# Patient Record
Sex: Female | Born: 1990 | Race: White | Hispanic: Yes | Marital: Single | Smoking: Never smoker
Health system: Southern US, Community
[De-identification: ages and names within clinical notes are randomized; demographics above are authoritative.]

## PROBLEM LIST (undated history)

## (undated) ENCOUNTER — Inpatient Hospital Stay (HOSPITAL_COMMUNITY): Payer: Self-pay

---
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
## 2009-06-10 ENCOUNTER — Ambulatory Visit: Payer: Self-pay | Admitting: Family

## 2009-06-10 ENCOUNTER — Inpatient Hospital Stay (HOSPITAL_COMMUNITY): Admission: AD | Admit: 2009-06-10 | Discharge: 2009-06-10 | Payer: Self-pay | Admitting: Obstetrics & Gynecology

## 2009-09-19 ENCOUNTER — Ambulatory Visit (HOSPITAL_COMMUNITY): Admission: RE | Admit: 2009-09-19 | Discharge: 2009-09-19 | Payer: Self-pay | Admitting: Obstetrics

## 2009-10-02 ENCOUNTER — Inpatient Hospital Stay (HOSPITAL_COMMUNITY): Admission: AD | Admit: 2009-10-02 | Discharge: 2009-10-02 | Payer: Self-pay | Admitting: Obstetrics

## 2009-11-07 ENCOUNTER — Ambulatory Visit (HOSPITAL_COMMUNITY): Admission: RE | Admit: 2009-11-07 | Discharge: 2009-11-07 | Payer: Self-pay | Admitting: Obstetrics

## 2010-01-02 ENCOUNTER — Ambulatory Visit (HOSPITAL_COMMUNITY): Admission: RE | Admit: 2010-01-02 | Discharge: 2010-01-02 | Payer: Self-pay | Admitting: Obstetrics

## 2010-01-02 IMAGING — US US OB FOLLOW-UP
1 series · 14 of 28 positions shown · non-contrast
Comparison: none

OBSTETRICAL ULTRASOUND:
 This ultrasound exam was performed in the [HOSPITAL] Ultrasound Department.  The OB US report was generated in the AS system, and faxed to the ordering physician.  This report is also available in [HOSPITAL]?s AccessANYware and in [REDACTED] PACS.

[Series 1: us ob follow up · 0.27mm/px · 14 of 33 slices shown]
[im 2/33]
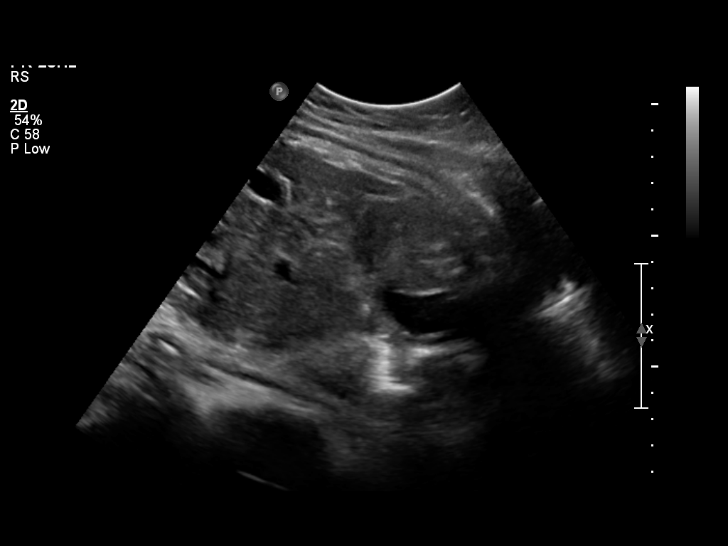
[im 4/33]
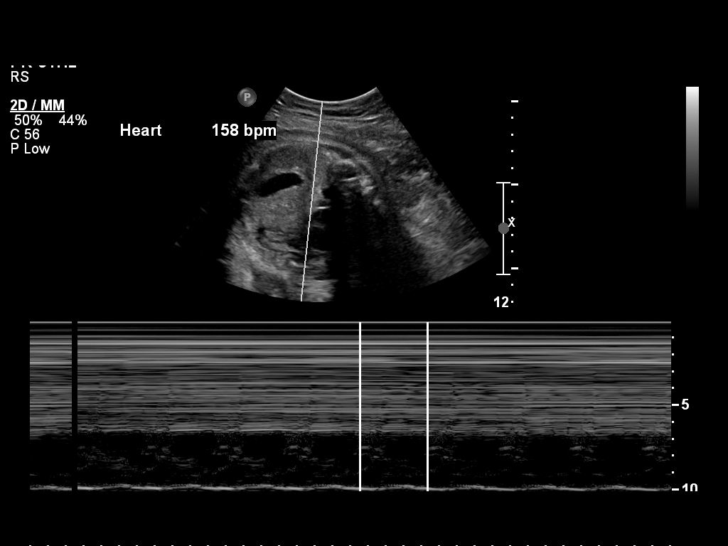
[im 6/33]
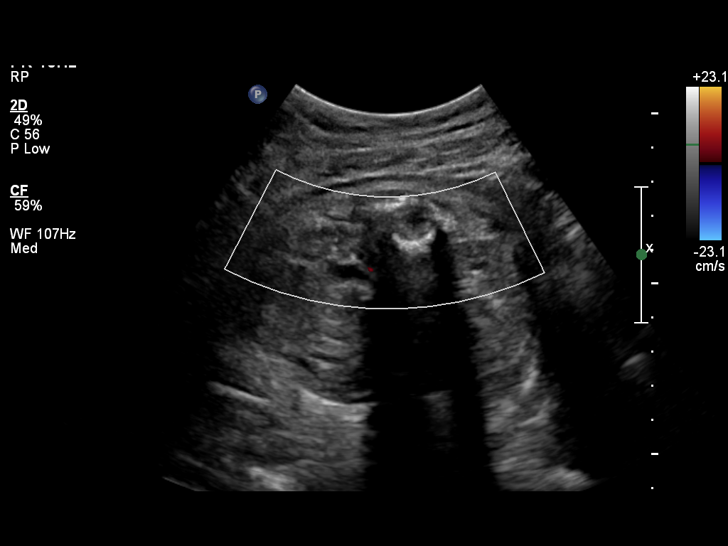
[im 9/33]
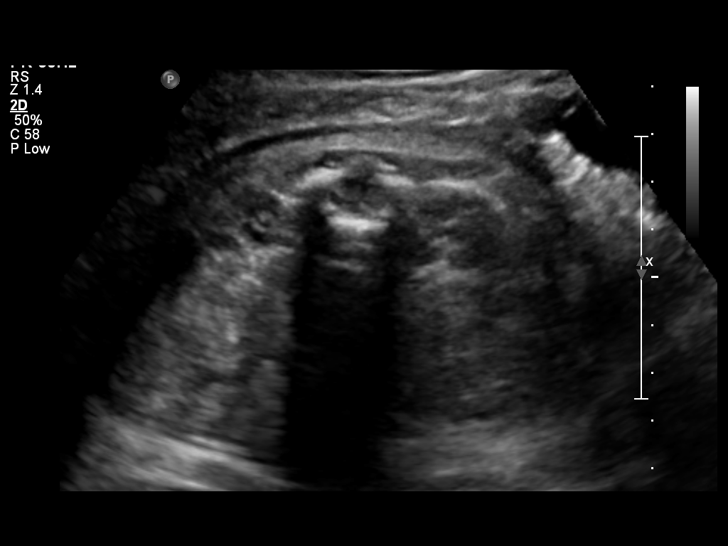
[im 11/33]
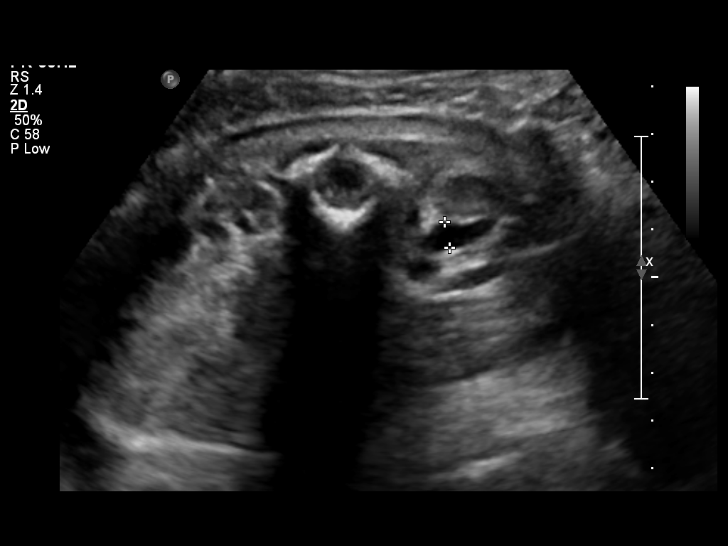
[im 14/33]
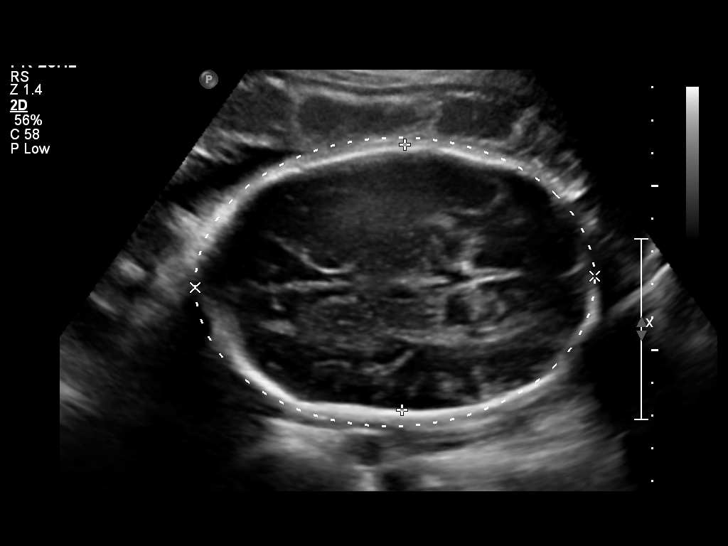
[im 16/33]
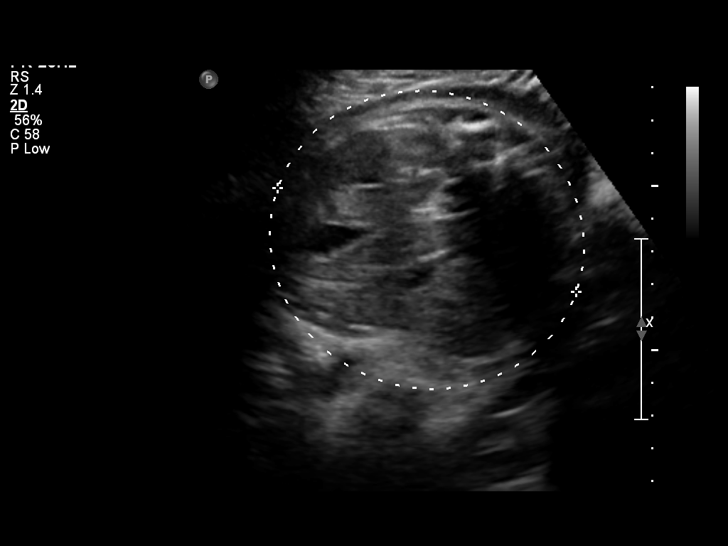
[im 18/33]
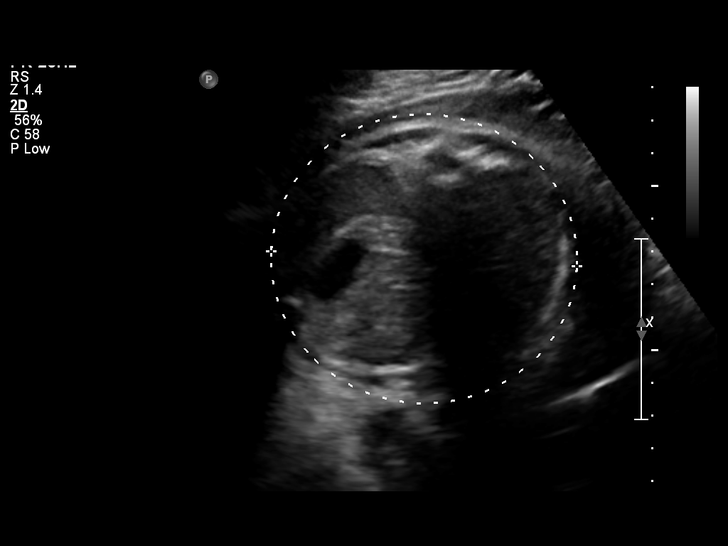
[im 21/33]
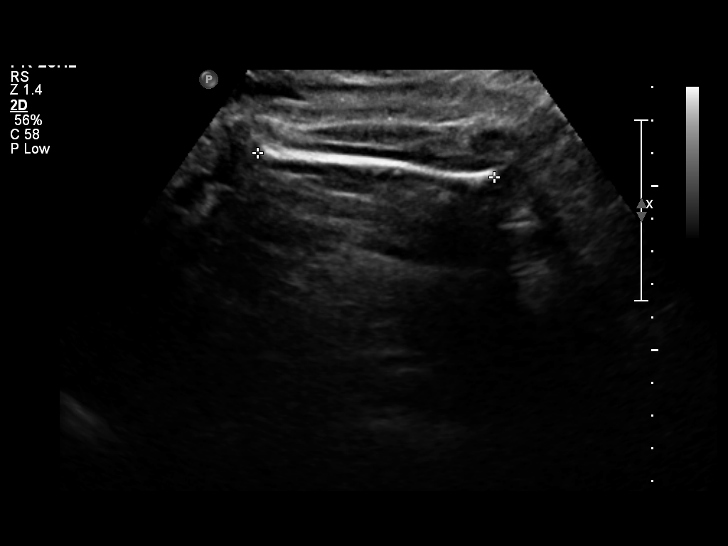
[im 23/33]
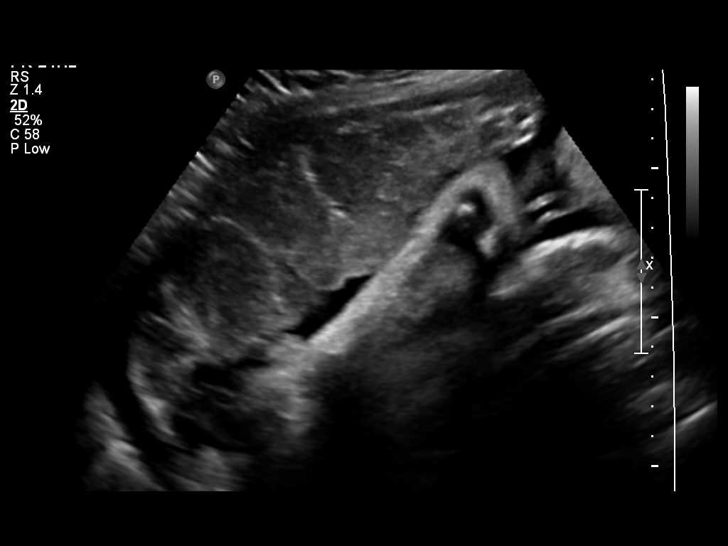
[im 25/33]
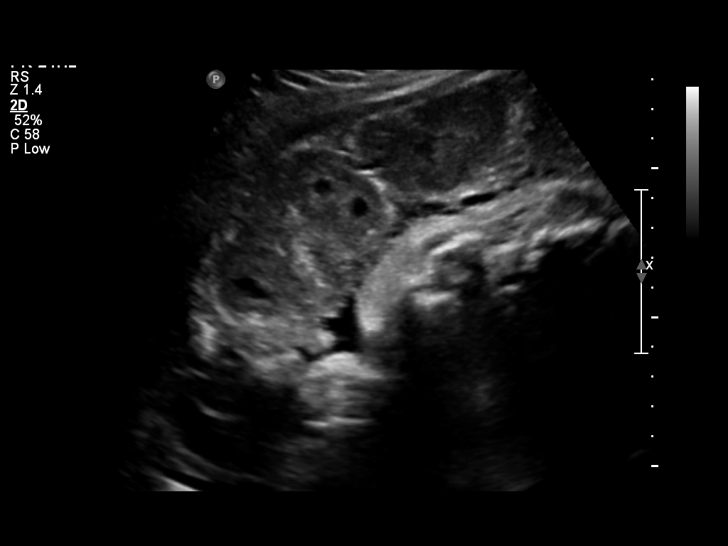
[im 28/33]
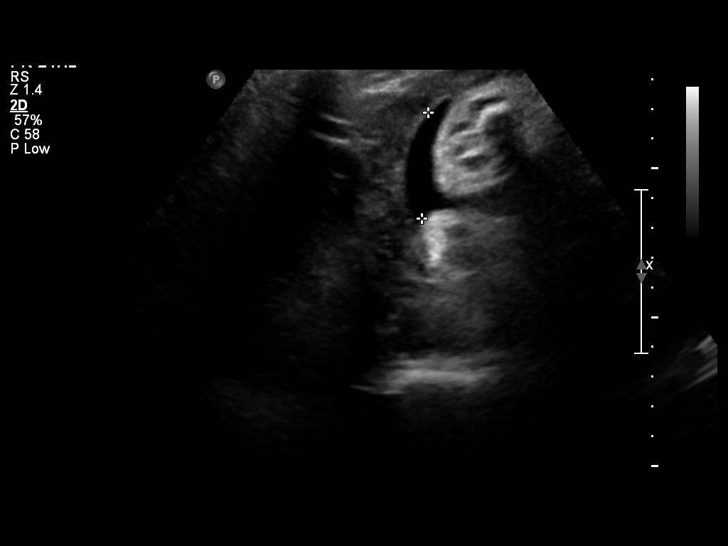
[im 30/33]
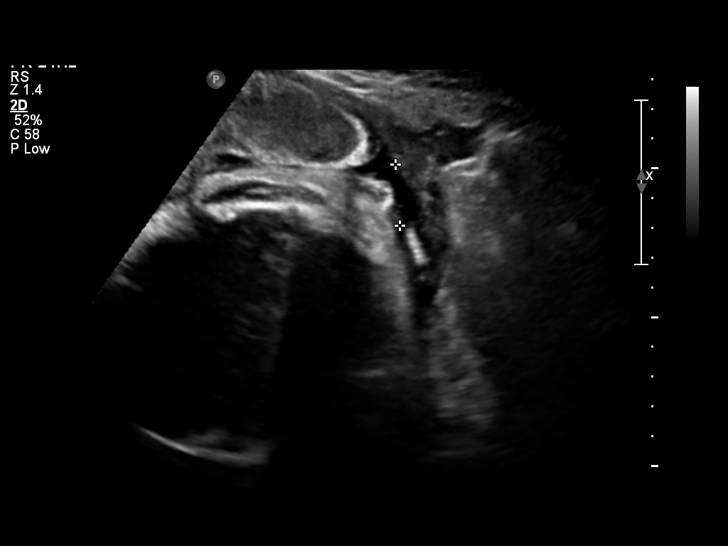
[im 33/33]
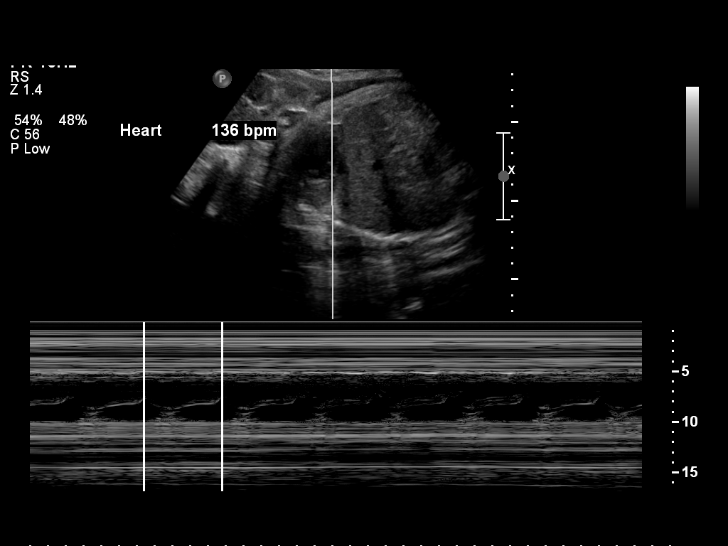

[14 of 28 positions shown; findings below may reference images not displayed]

IMPRESSION: See AS Obstetric US report.

## 2010-01-05 ENCOUNTER — Inpatient Hospital Stay (HOSPITAL_COMMUNITY): Admission: AD | Admit: 2010-01-05 | Discharge: 2010-01-05 | Payer: Self-pay | Admitting: Obstetrics

## 2010-01-10 ENCOUNTER — Ambulatory Visit (HOSPITAL_COMMUNITY): Admission: RE | Admit: 2010-01-10 | Discharge: 2010-01-10 | Payer: Self-pay | Admitting: Obstetrics

## 2010-11-17 ENCOUNTER — Encounter: Payer: Self-pay | Admitting: Obstetrics

## 2011-01-28 LAB — URINE CULTURE

## 2011-01-28 LAB — URINE MICROSCOPIC-ADD ON

## 2011-01-28 LAB — URINALYSIS, ROUTINE W REFLEX MICROSCOPIC
Nitrite: NEGATIVE
Protein, ur: NEGATIVE mg/dL

## 2011-02-01 LAB — URINALYSIS, ROUTINE W REFLEX MICROSCOPIC
Ketones, ur: NEGATIVE mg/dL
Urobilinogen, UA: 0.2 mg/dL (ref 0.0–1.0)

## 2011-02-01 LAB — WET PREP, GENITAL
Trich, Wet Prep: NONE SEEN
Yeast Wet Prep HPF POC: NONE SEEN

## 2011-02-01 LAB — URINE MICROSCOPIC-ADD ON

## 2011-02-01 LAB — HCG, QUANTITATIVE, PREGNANCY: hCG, Beta Chain, Quant, S: 24475 m[IU]/mL — ABNORMAL HIGH (ref <5)

## 2011-02-01 LAB — CBC
HCT: 34 % — ABNORMAL LOW (ref 36.0–46.0)
Hemoglobin: 11.8 g/dL — ABNORMAL LOW (ref 12.0–15.0)
MCHC: 34.7 g/dL (ref 30.0–36.0)
MCV: 91.7 fL (ref 78.0–100.0)
RDW: 13.7 % (ref 11.5–15.5)
WBC: 9.6 10*3/uL (ref 4.0–10.5)

## 2012-04-13 ENCOUNTER — Emergency Department (HOSPITAL_COMMUNITY)
Admission: EM | Admit: 2012-04-13 | Discharge: 2012-04-13 | Disposition: A | Discharge status: Other Acute Inpatient Hospital | Payer: 59 | Attending: Emergency Medicine | Admitting: Emergency Medicine

## 2012-04-13 ENCOUNTER — Encounter (HOSPITAL_COMMUNITY): Payer: Self-pay | Admitting: *Deleted

## 2012-04-13 DIAGNOSIS — R440 Auditory hallucinations: Secondary | ICD-10-CM

## 2012-04-13 DIAGNOSIS — Z0289 Encounter for other administrative examinations: Secondary | ICD-10-CM | POA: Insufficient documentation

## 2012-04-13 DIAGNOSIS — R443 Hallucinations, unspecified: Secondary | ICD-10-CM | POA: Insufficient documentation

## 2012-04-13 LAB — COMPREHENSIVE METABOLIC PANEL
AST: 16 U/L (ref 0–37)
Albumin: 4 g/dL (ref 3.5–5.2)
Calcium: 9.6 mg/dL (ref 8.4–10.5)
Chloride: 104 mEq/L (ref 96–112)
Creatinine, Ser: 0.64 mg/dL (ref 0.50–1.10)
Sodium: 137 mEq/L (ref 135–145)
Total Bilirubin: 0.4 mg/dL (ref 0.3–1.2)

## 2012-04-13 LAB — RAPID URINE DRUG SCREEN, HOSP PERFORMED
Barbiturates: NOT DETECTED
Benzodiazepines: NOT DETECTED
Cocaine: NOT DETECTED
Tetrahydrocannabinol: NOT DETECTED

## 2012-04-13 LAB — CBC
MCV: 87 fL (ref 78.0–100.0)
Platelets: 340 10*3/uL (ref 150–400)
RDW: 13.3 % (ref 11.5–15.5)
WBC: 9.1 10*3/uL (ref 4.0–10.5)

## 2012-04-13 MED ORDER — IBUPROFEN 600 MG PO TABS: 600.0000 mg | ORAL_TABLET | Freq: Three times a day (TID) | ORAL | Status: DC | PRN

## 2012-04-13 MED ORDER — NICOTINE 21 MG/24HR TD PT24: 21.0000 mg | MEDICATED_PATCH | Freq: Every day | TRANSDERMAL | Status: DC

## 2012-04-13 MED ORDER — ONDANSETRON HCL 4 MG PO TABS: 4.0000 mg | ORAL_TABLET | Freq: Three times a day (TID) | ORAL | Status: DC | PRN

## 2012-04-13 MED ORDER — ZOLPIDEM TARTRATE 5 MG PO TABS: 5.0000 mg | ORAL_TABLET | Freq: Every evening | ORAL | Status: DC | PRN

## 2012-04-13 MED ORDER — ACETAMINOPHEN 325 MG PO TABS: 650.0000 mg | ORAL_TABLET | ORAL | Status: DC | PRN

## 2012-04-13 MED ORDER — ALUM & MAG HYDROXIDE-SIMETH 200-200-20 MG/5ML PO SUSP: 30.0000 mL | ORAL | Status: DC | PRN

## 2012-04-13 NOTE — ED Notes (Signed)
Up to the bathroom 

## 2012-04-13 NOTE — ED Notes (Addendum)
Pt's mother in w/ pt.  Both are aware that the pt has been accepted at [facility] and will transfer today. Purpose for admission disussed  W/ .  Mom requesting to talk w/ EDP about the necessity of the admission.  Dr Patria Mane aware and will be in to talk w/ them. Address and name of he hospital and phone number given to mother.

## 2012-04-13 NOTE — ED Notes (Signed)
Significant other into see 

## 2012-04-13 NOTE — ED Notes (Signed)
Dr Patria Mane in to talk w/ pt's mother

## 2012-04-13 NOTE — ED Notes (Signed)
Pt brought here by [facility] Dept Deputy w/ IVC paperwork. Pt states since she was 21y/o she has been hearing a voice telling her to kill her father and occasionally to kill herself - pt states she has never acted on these voices and today while at school was telling the school counselor about this. Pt denies any current SI. Pt very calm and cooperative at present.

## 2012-04-13 NOTE — ED Notes (Signed)
Up to the desk on the phone 

## 2012-04-13 NOTE — ED Notes (Signed)
Pt in group 

## 2012-04-13 NOTE — ED Notes (Signed)
Dr Patria Mane in w/ pt

## 2012-04-13 NOTE — ED Notes (Signed)
Copy of chart,and  [facility] sent w/ pt

## 2012-04-13 NOTE — ED Notes (Signed)
Pt's mother in w/ pt and is upset that the pt is here. (Verbal permission from pt to talk w/mother).  Pt and mom are aware of the IVC procedures explained  Mom and pt verbalized  Understanding.  After mom left pt again denied si/hi, but reported that the voices have started coming back.  That they occur off/on and tell her to hurt her kids.  Pt reports that the voice is her voice and that she is usually alone when they occur, but some times when the kids are asleep.

## 2012-04-13 NOTE — ED Provider Notes (Signed)
Medical screening examination/treatment/procedure(s) were performed by non-physician practitioner and as supervising physician I was immediately available for consultation/collaboration.   Juquan Reznick L Adelynne Joerger, MD 04/13/12 0826 

## 2012-04-13 NOTE — ED Notes (Signed)
Dr campos in w/ pt 

## 2012-04-13 NOTE — ED Notes (Signed)
Pt ambulatory w/ sheriff's dept to [facility],  Pt pocketbook given to mother in patients presence.

## 2012-04-13 NOTE — ED Provider Notes (Signed)
History     CSN: [id number]  Arrival date & time 04/13/12  0128   First MD Initiated Contact with Patient 04/13/12 0240      Chief Complaint  Patient presents with  . Medical Clearance    (Consider location/radiation/quality/duration/timing/severity/associated sxs/prior treatment) HPI  She presents to the emergency department brought in by [facility] with IVC paperwork. Patient told her counselor today that since she was 47 she's been hearing voices telling her to kill her father and that she had children he voices are not telling her to kill her kids. She states that she never actually voices and has never had a plan to act on these voices but was concerned about them and decided to discuss it with her counselors. The counselor then made a phone call and got the IVC paperwork started in the patient being brought to the artery evaluation.. Denies being suicidal or having homicidal ideation. The patient is very calm and cooperative. The patient denies having any medical concerns she is in no acute distress her vital signs are stable.  History reviewed. No pertinent past medical history.  History reviewed. No pertinent past surgical history.  History reviewed. No pertinent family history.  History  Substance Use Topics  . Smoking status: Not on file  . Smokeless tobacco: Not on file  . Alcohol Use: Not on file    OB History    Grav Para Term Preterm Abortions TAB SAB Ect Mult Living                  Review of Systems  HEENT: denies blurry vision or change in hearing PULMONARY: Denies difficulty breathing and SOB CARDIAC: denies chest pain or heart palpitations MUSCULOSKELETAL:  denies being unable to ambulate ABDOMEN AL: denies abdominal pain GU: denies loss of bowel or urinary control NEURO: denies numbness and tingling in extremities      Allergies  Review of patient's allergies indicates no known allergies.  Home Medications  No current outpatient prescriptions on  file.  BP 108/74  Pulse 84  Temp 97.8 F (36.6 C) (Oral)  Resp 16  SpO2 99%  LMP 03/15/2012  Physical Exam  Nursing note and vitals reviewed. Constitutional: She appears well-developed and well-nourished. No distress.  HENT:  Head: Normocephalic and atraumatic.  Eyes: Pupils are equal, round, and reactive to light.  Neck: Normal range of motion. Neck supple.  Cardiovascular: Normal rate and regular rhythm.   Pulmonary/Chest: Effort normal.  Abdominal: Soft.  Neurological: She is alert.  Skin: Skin is warm and dry.    ED Course  Procedures (including critical care time)  Labs Reviewed  COMPREHENSIVE METABOLIC PANEL - Abnormal; Notable for the following:    Glucose, Bld 105 (*)     All other components within normal limits  CBC  URINE RAPID DRUG SCREEN (HOSP PERFORMED)  ETHANOL  POCT PREGNANCY, URINE   No results found.   1. Auditory hallucination       MDM  ACT to evaluate       Dorthula Matas, PA 04/13/12 [phone]

## 2012-04-13 NOTE — ED Notes (Signed)
Dr campos into see 

## 2012-04-13 NOTE — ED Notes (Signed)
1 bag of belonging sent w/ pt

## 2012-04-13 NOTE — ED Notes (Signed)
Talking w/ mother

## 2012-04-13 NOTE — ED Notes (Signed)
theraputic alternatives here to see

## 2012-04-13 NOTE — Progress Notes (Signed)
Behavioral Health Group  Co-facilitated behavioral health group w/ Corliss Marcus, MA, MEd, LPCA, NCC, for pt's in [facility]. Group was open and engaged w/ mutual sharing and support. Group focused on grief and loss, specifically losses related to mental health and substance use and how to work through grief processes associated with these losses.  Pt was progressively engaged in the group, was initially quiet but became tearful when another member became tearful. Pt shared that she was not happy with how she arrived in [facility], states that she tried to reach out to a counselor for help then the next thing she knew [employer] was at her house, handcuffed her, and brought her to [facility] ED. Pt stated "they came in my house, put me in handcuffs, the put me in a police car in front of my family and neighbors - thank God my kids were asleep. I was humiliated." Pt stated that she has been hearing voices on and off since 21y/o, stated that 1st voice told her to kill father then it stopped and current voice occ states to kill children. Pt stated that she has never felt compelled to obey voices and was seeking school counselor's advice on how to make voices stop. Pt stated she never thought she would be placed in hospital. Pt again became tearful when other group members provided support. Pt was polite and engaged, well grounded, and connected w/ other's stories of grief/loss, stated she was scared, tired, and wanted to go home.  Consuela Widener B MS, LPCA, NCC

## 2013-10-27 NOTE — L&D Delivery Note (Signed)
Delivery Note  [name] Apgars 8,9 over intact perineum.  Placenta delivered spontaneously intact with 3VC. good support and hemostasis noted and R/V exam confirms.  PH art was sent.  [place] cord blood was not done.  Mother and baby were doing well.  EBL 150cc  Tiffany Campavid Ahmari Garton, MD

## 2014-02-10 LAB — OB RESULTS CONSOLE GC/CHLAMYDIA
CHLAMYDIA, DNA PROBE: NEGATIVE
Gonorrhea: NEGATIVE

## 2014-02-10 LAB — OB RESULTS CONSOLE RUBELLA ANTIBODY, IGM: RUBELLA: IMMUNE

## 2014-02-10 LAB — OB RESULTS CONSOLE HIV ANTIBODY (ROUTINE TESTING): HIV: NONREACTIVE

## 2014-02-10 LAB — OB RESULTS CONSOLE HEPATITIS B SURFACE ANTIGEN: HEP B S AG: NEGATIVE

## 2014-02-24 LAB — OB RESULTS CONSOLE GC/CHLAMYDIA
Chlamydia: NEGATIVE
GC PROBE AMP, GENITAL: NEGATIVE

## 2014-03-22 ENCOUNTER — Other Ambulatory Visit: Payer: Self-pay

## 2014-04-27 ENCOUNTER — Other Ambulatory Visit (HOSPITAL_COMMUNITY): Payer: Self-pay | Admitting: Obstetrics and Gynecology

## 2014-04-27 DIAGNOSIS — O09899 Supervision of other high risk pregnancies, unspecified trimester: Secondary | ICD-10-CM

## 2014-04-27 DIAGNOSIS — O09219 Supervision of pregnancy with history of pre-term labor, unspecified trimester: Secondary | ICD-10-CM

## 2014-04-27 DIAGNOSIS — IMO0002 Reserved for concepts with insufficient information to code with codable children: Secondary | ICD-10-CM

## 2014-04-27 DIAGNOSIS — Z3689 Encounter for other specified antenatal screening: Secondary | ICD-10-CM

## 2014-05-03 ENCOUNTER — Encounter (HOSPITAL_COMMUNITY): Payer: Self-pay

## 2014-05-03 ENCOUNTER — Ambulatory Visit (HOSPITAL_COMMUNITY)
Admission: RE | Admit: 2014-05-03 | Discharge: 2014-05-03 | Disposition: A | Discharge status: Home / Self Care | Payer: 59 | Source: Ambulatory Visit | Attending: Obstetrics and Gynecology | Admitting: Obstetrics and Gynecology

## 2014-05-03 VITALS — BP 124/67 | HR 97 | Wt 167.8 lb

## 2014-05-03 DIAGNOSIS — IMO0002 Reserved for concepts with insufficient information to code with codable children: Secondary | ICD-10-CM

## 2014-05-03 DIAGNOSIS — O09219 Supervision of pregnancy with history of pre-term labor, unspecified trimester: Secondary | ICD-10-CM | POA: Insufficient documentation

## 2014-05-03 DIAGNOSIS — Z3689 Encounter for other specified antenatal screening: Secondary | ICD-10-CM | POA: Insufficient documentation

## 2014-05-03 DIAGNOSIS — O09899 Supervision of other high risk pregnancies, unspecified trimester: Secondary | ICD-10-CM

## 2014-05-03 DIAGNOSIS — O365990 Maternal care for other known or suspected poor fetal growth, unspecified trimester, not applicable or unspecified: Secondary | ICD-10-CM | POA: Insufficient documentation

## 2014-05-31 ENCOUNTER — Ambulatory Visit (HOSPITAL_COMMUNITY): Payer: 59

## 2014-06-06 ENCOUNTER — Ambulatory Visit (HOSPITAL_COMMUNITY)
Admission: RE | Admit: 2014-06-06 | Discharge: 2014-06-06 | Disposition: A | Discharge status: Home / Self Care | Payer: 59 | Source: Ambulatory Visit | Attending: Obstetrics and Gynecology | Admitting: Obstetrics and Gynecology

## 2014-06-06 ENCOUNTER — Encounter (HOSPITAL_COMMUNITY): Payer: Self-pay

## 2014-06-06 VITALS — BP 120/74 | HR 104 | Wt 175.0 lb

## 2014-06-06 DIAGNOSIS — Z3689 Encounter for other specified antenatal screening: Secondary | ICD-10-CM | POA: Insufficient documentation

## 2014-06-06 DIAGNOSIS — O358XX0 Maternal care for other (suspected) fetal abnormality and damage, not applicable or unspecified: Secondary | ICD-10-CM

## 2014-07-11 ENCOUNTER — Ambulatory Visit (HOSPITAL_COMMUNITY): Payer: 59

## 2014-07-25 ENCOUNTER — Encounter (HOSPITAL_COMMUNITY): Payer: Self-pay | Admitting: *Deleted

## 2014-07-25 ENCOUNTER — Observation Stay (HOSPITAL_COMMUNITY)
Admission: AD | Admit: 2014-07-25 | Discharge: 2014-07-26 | Disposition: A | Discharge status: Home / Self Care | Payer: 59 | Source: Ambulatory Visit | Attending: Obstetrics and Gynecology | Admitting: Obstetrics and Gynecology

## 2014-07-25 DIAGNOSIS — O4100X0 Oligohydramnios, unspecified trimester, not applicable or unspecified: Principal | ICD-10-CM | POA: Diagnosis present

## 2014-07-25 DIAGNOSIS — Z0371 Encounter for suspected problem with amniotic cavity and membrane ruled out: Secondary | ICD-10-CM

## 2014-07-25 DIAGNOSIS — O4103X1 Oligohydramnios, third trimester, fetus 1: Secondary | ICD-10-CM

## 2014-07-25 DIAGNOSIS — O358XX0 Maternal care for other (suspected) fetal abnormality and damage, not applicable or unspecified: Secondary | ICD-10-CM

## 2014-07-25 LAB — CBC
HEMATOCRIT: 31.4 % — AB (ref 36.0–46.0)
Hemoglobin: 10.9 g/dL — ABNORMAL LOW (ref 12.0–15.0)
MCH: 31 pg (ref 26.0–34.0)
MCHC: 34.7 g/dL (ref 30.0–36.0)
MCV: 89.2 fL (ref 78.0–100.0)
PLATELETS: 322 10*3/uL (ref 150–400)
RBC: 3.52 MIL/uL — ABNORMAL LOW (ref 3.87–5.11)
RDW: 12.9 % (ref 11.5–15.5)
WBC: 9.8 10*3/uL (ref 4.0–10.5)

## 2014-07-25 LAB — TYPE AND SCREEN
ABO/RH(D): O POS
Antibody Screen: NEGATIVE

## 2014-07-25 MED ORDER — ACETAMINOPHEN 325 MG PO TABS: 650.0000 mg | ORAL_TABLET | ORAL | Status: DC | PRN

## 2014-07-25 MED ORDER — LACTATED RINGERS IV SOLN
INTRAVENOUS | Status: DC
  Administered 2014-07-25 – 2014-07-26 (×4): via INTRAVENOUS

## 2014-07-25 MED ORDER — BETAMETHASONE SOD PHOS & ACET 6 (3-3) MG/ML IJ SUSP
12.0000 mg | Freq: Once | INTRAMUSCULAR | Status: DC
  Filled 2014-07-25: qty 2

## 2014-07-25 MED ORDER — DOCUSATE SODIUM 100 MG PO CAPS: 100.0000 mg | ORAL_CAPSULE | Freq: Every day | ORAL | Status: DC

## 2014-07-25 MED ORDER — PRENATAL MULTIVITAMIN CH
1.0000 | ORAL_TABLET | Freq: Every day | ORAL | Status: DC
  Filled 2014-07-25: qty 1

## 2014-07-25 MED ORDER — CALCIUM CARBONATE ANTACID 500 MG PO CHEW: 2.0000 | CHEWABLE_TABLET | ORAL | Status: DC | PRN

## 2014-07-25 MED ORDER — ZOLPIDEM TARTRATE 5 MG PO TABS: 5.0000 mg | ORAL_TABLET | Freq: Every evening | ORAL | Status: DC | PRN

## 2014-07-25 NOTE — H&P (Signed)
Tiffany FragminJanet Morrow is a 23 y.o. female presenting for observation due to US today showing low AFI of 8.7cm (5%) but EFW 2043gm (83%).  Pt has a history of 2 PTD with a question of IUGR in last pregnancy.  She has been on Progesterone IM since 16 weeks and has not had PTL sxs this pregnancy.  Previous US  At 29 weeks showed EFW 79% with AFI 12.7 (35%). History OB History   Grav Para Term Preterm Abortions TAB SAB Ect Mult Living   3 2 0 2 0 0 0 0 0 2      No past medical history on file. No past surgical history on file. Family History: family history is not on file. Social History:  has no tobacco, alcohol, and drug history on file.   Prenatal Transfer Tool  Maternal Diabetes: No Genetic Screening: Normal Maternal Ultrasounds/Referrals: Normal Fetal Ultrasounds or other Referrals:  None Maternal Substance Abuse:  No Significant Maternal Medications:  None Significant Maternal Lab Results:  None Other Comments:  None  ROS    Blood pressure 137/75, pulse 107, temperature 98 F (36.7 C), temperature source Oral, resp. rate 18, height 5\' 4"  (1.626 m), weight 82.555 kg (182 lb), last menstrual period 12/13/2013. Exam Physical Exam  Prenatal labs: ABO, Rh: --/--/O POS (09/29 1405) Antibody: NEG (09/29 1405) Rubella:   RPR:    HBsAg:    HIV:    GBS:     Assessment/Plan: IUP at 32 weeks with US today showing Oligohydraminos with out evidence of IUGR Plan overnight observation, IVF hydrations, BMZ series and repeat US in am.  EFM is reassuring. History of PTL on Prog IM weekly without problems at this time   Tiffany Morrow C 07/25/2014, 4:20 PM

## 2014-07-26 ENCOUNTER — Inpatient Hospital Stay (HOSPITAL_COMMUNITY): Payer: 59

## 2014-07-26 DIAGNOSIS — O4100X0 Oligohydramnios, unspecified trimester, not applicable or unspecified: Secondary | ICD-10-CM | POA: Diagnosis present

## 2014-07-26 LAB — RPR

## 2014-07-26 NOTE — Progress Notes (Signed)
Although BMZ series ordered, pt declined per Dr Rana SnareLowe, I discussed with her today>>>she declines BMZ.  US f/u today showed BPP 8/8 with AFI 31%>>>will see in office 1 week with f/u [place]

## 2014-07-26 NOTE — Discharge Instructions (Signed)
Oligohydramnios °An unborn baby (fetus) lives in the mother's uterus in a sac of amniotic fluid. This fluid: °· Protects the fetus from trauma. °· Helps the fetus move freely inside the uterus. °· Helps the fetal lungs, kidneys, and digestive system develop. °· Protects the baby from infections.   °Oligohydramnios is when there is not enough amniotic fluid in the amniotic sac. If this happens early in pregnancy, a fetus might not develop normally. If this happens in the second half of a pregnancy, a fetus might not grow as much as it should and could cause problems during delivery.   °Oligohydramnios can also cause: °· Pregnancy loss (miscarriage). °· Premature birth. °· Deformities of the face or body. °· Problems with muscles and bones. °· Pressure and compression on the umbilical cord, which decreases oxygen to the fetus. °· Lung problems. °· Stillbirth. °CAUSES °A cause cannot always be found. However, possible causes include: °· A leak or a tear in the amniotic sac. °· A problem with the placenta. °· Having identical twins who share the same placenta. °· A fetal birth defect. This is usually something in the fetal kidneys or urinary tract that has not developed as it should. °· A pregnancy that goes past the due date. °· Something that affects the mother's body, such as: °¨ Dehydration. °¨ High blood pressure. °¨ Diabetes. °¨ Some medications (examples include ibuprofen and blood pressure medicines).  °¨ A disease that affects the skin, joints, kidneys, and other organs (systemic lupus). °¨ Birth defects. °SYMPTOMS °· There may be no symptoms.  °· Leaking fluid from the vagina. °· Measuring smaller than usual uterus at a routine pregnancy exam. °DIAGNOSIS °To diagnose and evaluate oligohydramnios, your caregiver may: °· Order a prenatal ultrasound test. This test: °¨ Measures the amniotic fluid level. This will show if the amount of fluid is right for the stage of pregnancy. °¨ Checks the fetal  kidneys. °¨ Checks fetal growth. °¨ Evaluates the placenta. °· Confirm that you broke your water, if this is suspected by your caregiver. °· Order a nonstress test. This noninvasive test is an assessment of fetal well-being. It monitors the fetal heart rate patterns over a 20-minute period. °· Order a biophysical profile. This test measures and evaluates 5 observations of the baby (results of nonstress testing, fetal breathing, movements, muscle tone, and amount of amniotic fluid). °· Assess fetal kick counts. Tell your caregiver if the baby appears to become less active. °· Order a uterine artery Doppler study. This is a type of ultrasound. It can show if enough blood and nourishment are getting to the fetus through the placenta and umbilical cord. °· Check your blood pressure. °· Check your blood sugar. °TREATMENT °Treatment will depend on how low the fluid is, how far along in the pregnancy you are, and your overall health. Treatment options include: °· Watching and waiting. You will need to be checked more often than normal. °· Increasing your fluid intake. This may be done by mouth, or you might get the fluids through the vein (intravenously, IV). °· Delivering the baby if recommended by your caregiver. °HOME CARE INSTRUCTIONS °· Only take medicine as directed by your caregiver, especially if you have a medical problem (diabetes, high blood pressure). °· Follow your caregiver's instructions regarding physical activity, especially if you have a medical problem (diabetes, high blood pressure). °· Keep all prenatal care appointments. °· Rest as much as possible. Your caregiver may put you on bed rest. °· Drink enough fluids to keep your urine clear or pale yellow. °·   Eat a healthy and nourishing diet.  Do not smoke, drink alcohol, or take illegal drugs. SEEK MEDICAL CARE IF:  You have any questions or worries about your pregnancy.  You notice a decrease in fetal movement. SEEK IMMEDIATE MEDICAL CARE IF:    Fluid comes out of your vagina.  You start to have labor pains (contractions).  You have a fever. Document Released: 01/28/2011 Document Revised: 02/27/2014 Document Reviewed: 01/28/2011 [facility] Patient Information 2015 [place]. This information is not intended to replace advice given to you by your health care provider. Make sure you discuss any questions you have with your health care provider.   Preterm Labor Information Preterm labor is when labor starts at less than 37 weeks of pregnancy. The normal length of a pregnancy is 39 to 41 weeks. CAUSES Often, there is no identifiable underlying cause as to why a woman goes into preterm labor. One of the most common known causes of preterm labor is infection. Infections of the uterus, cervix, vagina, amniotic sac, bladder, kidney, or even the lungs (pneumonia) can cause labor to start. Other suspected causes of preterm labor include:   Urogenital infections, such as yeast infections and bacterial vaginosis.   Uterine abnormalities (uterine shape, uterine septum, fibroids, or bleeding from the placenta).   A cervix that has been operated on (it may fail to stay closed).   Malformations in the fetus.   Multiple gestations (twins, triplets, and so on).   Breakage of the amniotic sac.  RISK FACTORS  Having a previous history of preterm labor.   Having premature rupture of membranes (PROM).   Having a placenta that covers the opening of the cervix (placenta previa).   Having a placenta that separates from the uterus (placental abruption).   Having a cervix that is too weak to hold the fetus in the uterus (incompetent cervix).   Having too much fluid in the amniotic sac (polyhydramnios).   Taking illegal drugs or smoking while pregnant.   Not gaining enough weight while pregnant.   Being younger than 2918 and older than 25 years old.   Having a low socioeconomic status.   Being African  American. SYMPTOMS Signs and symptoms of preterm labor include:   Menstrual-like cramps, abdominal pain, or back pain.  Uterine contractions that are regular, as frequent as six in an hour, regardless of their intensity (may be mild or painful).  Contractions that start on the top of the uterus and spread down to the lower abdomen and back.   A sense of increased pelvic pressure.   A watery or bloody mucus discharge that comes from the vagina.  TREATMENT Depending on the length of the pregnancy and other circumstances, your health care provider may suggest bed rest. If necessary, there are medicines that can be given to stop contractions and to mature the fetal lungs. If labor happens before 34 weeks of pregnancy, a prolonged hospital stay may be recommended. Treatment depends on the condition of both you and the fetus.  WHAT SHOULD YOU DO IF YOU THINK YOU ARE IN PRETERM LABOR? Call your health care provider right away. You will need to go to the hospital to get checked immediately. HOW CAN YOU PREVENT PRETERM LABOR IN FUTURE PREGNANCIES? You should:   Stop smoking if you smoke.  Maintain healthy weight gain and avoid chemicals and drugs that are not necessary.  Be watchful for any type of infection.  Inform your health care provider if you have a known history of  preterm labor. Document Released: 01/03/2004 Document Revised: 06/15/2013 Document Reviewed: 11/15/2012 [facility] Patient Information 2015 [place], [place]. This information is not intended to replace advice given to you by your health care provider. Make sure you discuss any questions you have with your health care provider.

## 2014-07-26 NOTE — Discharge Summary (Signed)
Obstetric Discharge Summary Reason for Admission: oligohydramnios, hx PTL Prenatal Procedures: weekly P4 Intrapartum Procedures: hydrattion/US Postpartum Procedures: none Complications-Operative and Postpartum: none Hemoglobin  Date Value Ref Range Status  07/25/2014 10.9* 12.0 - 15.0 g/dL Final     HCT  Date Value Ref Range Status  07/25/2014 31.4* 36.0 - 46.0 % Final    Physical Exam:  General: alert Lochia: appropriate Uterine Fundus: soft Incision: NA DVT Evaluation: No evidence of DVT seen on physical exam.  Discharge Diagnoses: 7832w1d, hx PTL, low AFI   Discharge Information: Date: 07/26/2014 Activity: pelvic rest Diet: routine Medications: PNV and weekly progesterone Condition: stable Instructions: refer to practice specific booklet and mod BR Discharge to: home Follow-up Information   Follow up with Meriel PicaHOLLAND,Yolani Vo M, MD In 1 week. (office will call)    Specialty:  Obstetrics and Gynecology   Contact information:   [address] [place] [address] [phone]       Newborn Data: <<This patient has not yet delivered during this pregnancy.>> Home with NA.  Richarda OverlieHOLLAND,Thresea Doble M 07/26/2014, 12:15 PM

## 2014-08-17 ENCOUNTER — Encounter (HOSPITAL_COMMUNITY): Payer: Self-pay | Admitting: *Deleted

## 2014-08-17 ENCOUNTER — Inpatient Hospital Stay (HOSPITAL_COMMUNITY)
Admission: AD | Admit: 2014-08-17 | Discharge: 2014-08-17 | Disposition: A | Discharge status: Home / Self Care | Payer: 59 | Source: Ambulatory Visit | Attending: Obstetrics and Gynecology | Admitting: Obstetrics and Gynecology

## 2014-08-17 DIAGNOSIS — Z3689 Encounter for other specified antenatal screening: Secondary | ICD-10-CM

## 2014-08-17 DIAGNOSIS — O368130 Decreased fetal movements, third trimester, not applicable or unspecified: Secondary | ICD-10-CM | POA: Diagnosis present

## 2014-08-17 DIAGNOSIS — O368131 Decreased fetal movements, third trimester, fetus 1: Secondary | ICD-10-CM

## 2014-08-17 DIAGNOSIS — Z3A35 35 weeks gestation of pregnancy: Secondary | ICD-10-CM | POA: Insufficient documentation

## 2014-08-17 LAB — URINE MICROSCOPIC-ADD ON

## 2014-08-17 LAB — URINALYSIS, ROUTINE W REFLEX MICROSCOPIC
Bilirubin Urine: NEGATIVE
GLUCOSE, UA: NEGATIVE mg/dL
Hgb urine dipstick: NEGATIVE
KETONES UR: NEGATIVE mg/dL
NITRITE: NEGATIVE
Protein, ur: NEGATIVE mg/dL
Specific Gravity, Urine: <1.005 — ABNORMAL LOW (ref 1.005–1.030)
Urobilinogen, UA: 0.2 mg/dL (ref 0.0–1.0)
pH: 7 (ref 5.0–8.0)

## 2014-08-17 NOTE — MAU Provider Note (Signed)
Chief Complaint:  Decreased Fetal Movement   First Provider Initiated Contact with Patient 08/17/14 1338      HPI: Tiffany Morrow is a 23 y.o. [id number] at 10952w2d who presents to maternity admissions sent from the office for decreased fetal movement and 6/8 BPP.  She is feeling good fetal movement in MAU.  She denies cramping/contractions, vaginal bleeding, vaginal itching/burning, urinary symptoms, h/a, dizziness, n/v, or fever/chills.     Past Medical History: Past Medical History  Diagnosis Date  . Preterm labor     Past obstetric history: OB History  Gravida Para Term Preterm AB SAB TAB Ectopic Multiple Living  3 2 0 2 0 0 0 0 0 2     # Outcome Date GA Lbr Len/2nd Weight Sex Delivery Anes PTL Lv  3 CUR           2 PRE           1 PRE               Past Surgical History: Past Surgical History  Procedure Laterality Date  . Cesarean section      Family History: History reviewed. No pertinent family history.  Social History: History  Substance Use Topics  . Smoking status: Never Smoker   . Smokeless tobacco: Not on file  . Alcohol Use: No    Allergies: No Known Allergies  Meds:  Prescriptions prior to admission  Medication Sig Dispense Refill  . Prenatal Vit-Fe Fumarate-FA (PRENATAL MULTIVITAMIN) TABS tablet Take 1 tablet by mouth daily at 12 noon.        ROS: Pertinent findings in history of present illness.  Physical Exam  Blood pressure 126/78, pulse 91, temperature 98.9 F (37.2 C), temperature source Oral, resp. rate 16, height 5\' 3"  (1.6 m), weight 84.55 kg (186 lb 6.4 oz), last menstrual period 12/13/2013, SpO2 100.00%. GENERAL: Well-developed, well-nourished female in no acute distress.  HEENT: normocephalic HEART: normal rate RESP: normal effort ABDOMEN: Soft, non-tender, gravid appropriate for gestational age EXTREMITIES: Nontender, no edema NEURO: alert and oriented     FHT:  Baseline 115 , moderate variability, accelerations present, no  decelerations Contractions: q4-10 mins, irregular, mild to palpation   Labs: Results for orders placed during the hospital encounter of 08/17/14 (from the past 24 hour(s))  URINALYSIS, ROUTINE W REFLEX MICROSCOPIC     Status: Abnormal   Collection Time    08/17/14  1:06 PM      Result Value Ref Range   Color, Urine YELLOW  YELLOW   APPearance CLEAR  CLEAR   Specific Gravity, Urine <1.005 (*) 1.005 - 1.030   pH 7.0  5.0 - 8.0   Glucose, UA NEGATIVE  NEGATIVE mg/dL   Hgb urine dipstick NEGATIVE  NEGATIVE   Bilirubin Urine NEGATIVE  NEGATIVE   Ketones, ur NEGATIVE  NEGATIVE mg/dL   Protein, ur NEGATIVE  NEGATIVE mg/dL   Urobilinogen, UA 0.2  0.0 - 1.0 mg/dL   Nitrite NEGATIVE  NEGATIVE   Leukocytes, UA TRACE (*) NEGATIVE  URINE MICROSCOPIC-ADD ON     Status: Abnormal   Collection Time    08/17/14  1:06 PM      Result Value Ref Range   Squamous Epithelial / LPF FEW (*) RARE   WBC, UA 0-2  <3 WBC/hpf    Assessment: 1. Decreased fetal movement, third trimester, fetus 1   2. NST (non-stress test) reactive     Plan: Consult Dr Rana SnareLowe Discharge home Labor precautions and fetal kick counts  Follow-up Information   Follow up with LOWE,DAVID C, MD. (Call to make appointment for Monday for non-stress test in the office. )    Specialty:  Obstetrics and Gynecology   Contact information:   [address] [place] [address] [phone]       [facility]. (Return to MAU if symptoms persist or worsen)    Contact information:   [id number] [place] [address] [phone]       Medication List         prenatal multivitamin Tabs tablet  Take 1 tablet by mouth daily at 12 noon.        Sharen CounterLisa Leftwich-Kirby Certified Nurse-Midwife 08/17/2014 1:45 PM

## 2014-08-17 NOTE — MAU Note (Signed)
Patient states she has felt decreased fetal movement all week. Had a BPP in the office today that was 6/8. Reports good fetal movement now. Denies bleeding, leaking or contractions.

## 2014-08-17 NOTE — Discharge Instructions (Signed)
Fetal Movement Counts Patient Name: __________________________________________________ Patient Due Date: ____________________ Performing a fetal movement count is highly recommended in high-risk pregnancies, but it is good for every pregnant woman to do. Your health care provider may ask you to start counting fetal movements at 28 weeks of the pregnancy. Fetal movements often increase:  After eating a full meal.  After physical activity.  After eating or drinking something sweet or cold.  At rest. Pay attention to when you feel the baby is most active. This will help you notice a pattern of your baby's sleep and wake cycles and what factors contribute to an increase in fetal movement. It is important to perform a fetal movement count at the same time each day when your baby is normally most active.  HOW TO COUNT FETAL MOVEMENTS 1. Find a quiet and comfortable area to sit or lie down on your left side. Lying on your left side provides the best blood and oxygen circulation to your baby. 2. Write down the day and time on a sheet of paper or in a journal. 3. Start counting kicks, flutters, swishes, rolls, or jabs in a 2-hour period. You should feel at least 10 movements within 2 hours. 4. If you do not feel 10 movements in 2 hours, wait 2-3 hours and count again. Look for a change in the pattern or not enough counts in 2 hours. SEEK MEDICAL CARE IF:  You feel less than 10 counts in 2 hours, tried twice.  There is no movement in over an hour.  The pattern is changing or taking longer each day to reach 10 counts in 2 hours.  You feel the baby is not moving as he or she usually does. Date: ____________ Movements: ____________ Start time: ____________ Doreatha MartinFinish time: ____________  Date: ____________ Movements: ____________ Start time: ____________ Doreatha MartinFinish time: ____________ Date: ____________ Movements: ____________ Start time: ____________ Doreatha MartinFinish time: ____________ Date: ____________ Movements:  ____________ Start time: ____________ Doreatha MartinFinish time: ____________ Date: ____________ Movements: ____________ Start time: ____________ Doreatha MartinFinish time: ____________ Date: ____________ Movements: ____________ Start time: ____________ Doreatha MartinFinish time: ____________ Date: ____________ Movements: ____________ Start time: ____________ Doreatha MartinFinish time: ____________ Document Released: 11/12/2006 Document Revised: 02/27/2014 Document Reviewed: 08/09/2012 [employer] Patient Information 2015 [employer]. This information is not intended to replace advice given to you by your health care provider. Make sure you discuss any questions you have with your health care provider.  Reasons to return to MAU:  1.  Contractions are  5 minutes apart or less, each last 1 minute, these have been going on for 1-2 hours, and you cannot walk or talk during them 2.  You have a large gush of fluid, or a trickle of fluid that will not stop and you have to wear a pad 3.  You have bleeding that is bright red, heavier than spotting--like menstrual bleeding (spotting can be normal in early labor or after a check of your cervix) 4.  You do not feel the baby moving like he/she normally does

## 2014-08-28 ENCOUNTER — Encounter (HOSPITAL_COMMUNITY): Payer: Self-pay | Admitting: *Deleted

## 2014-08-31 LAB — OB RESULTS CONSOLE GBS: GBS: NEGATIVE

## 2014-09-01 ENCOUNTER — Inpatient Hospital Stay (HOSPITAL_COMMUNITY)
Admission: AD | Admit: 2014-09-01 | Discharge: 2014-09-03 | DRG: 775 | Disposition: A | Discharge status: Home / Self Care | Payer: 59 | Source: Ambulatory Visit | Attending: Obstetrics and Gynecology | Admitting: Obstetrics and Gynecology

## 2014-09-01 ENCOUNTER — Inpatient Hospital Stay (HOSPITAL_COMMUNITY): Payer: 59 | Admitting: Anesthesiology

## 2014-09-01 ENCOUNTER — Encounter (HOSPITAL_COMMUNITY): Payer: Self-pay | Admitting: *Deleted

## 2014-09-01 DIAGNOSIS — IMO0001 Reserved for inherently not codable concepts without codable children: Secondary | ICD-10-CM

## 2014-09-01 DIAGNOSIS — Z3A37 37 weeks gestation of pregnancy: Secondary | ICD-10-CM | POA: Diagnosis present

## 2014-09-01 DIAGNOSIS — O3421 Maternal care for scar from previous cesarean delivery: Secondary | ICD-10-CM | POA: Diagnosis present

## 2014-09-01 DIAGNOSIS — Z3483 Encounter for supervision of other normal pregnancy, third trimester: Secondary | ICD-10-CM | POA: Diagnosis present

## 2014-09-01 LAB — CBC
HEMATOCRIT: 31.8 % — AB (ref 36.0–46.0)
HEMOGLOBIN: 10.7 g/dL — AB (ref 12.0–15.0)
MCH: 29.1 pg (ref 26.0–34.0)
MCHC: 33.6 g/dL (ref 30.0–36.0)
MCV: 86.4 fL (ref 78.0–100.0)
Platelets: 322 10*3/uL (ref 150–400)
RBC: 3.68 MIL/uL — AB (ref 3.87–5.11)
RDW: 13.3 % (ref 11.5–15.5)
WBC: 10 10*3/uL (ref 4.0–10.5)

## 2014-09-01 LAB — TYPE AND SCREEN
ABO/RH(D): O POS
ANTIBODY SCREEN: NEGATIVE

## 2014-09-01 MED ORDER — PHENYLEPHRINE 40 MCG/ML (10ML) SYRINGE FOR IV PUSH (FOR BLOOD PRESSURE SUPPORT)
PREFILLED_SYRINGE | INTRAVENOUS | Status: AC
  Filled 2014-09-01: qty 10

## 2014-09-01 MED ORDER — PENICILLIN G POTASSIUM 5000000 UNITS IJ SOLR
5.0000 10*6.[IU] | Freq: Once | INTRAVENOUS | Status: DC
  Filled 2014-09-01: qty 5

## 2014-09-01 MED ORDER — FENTANYL 2.5 MCG/ML BUPIVACAINE 1/10 % EPIDURAL INFUSION (WH - ANES): 14.0000 mL/h | INTRAMUSCULAR | Status: DC | PRN

## 2014-09-01 MED ORDER — FENTANYL 2.5 MCG/ML BUPIVACAINE 1/10 % EPIDURAL INFUSION (WH - ANES)
INTRAMUSCULAR | Status: AC
  Filled 2014-09-01: qty 125

## 2014-09-01 MED ORDER — BUTORPHANOL TARTRATE 1 MG/ML IJ SOLN
2.0000 mg | Freq: Once | INTRAMUSCULAR | Status: AC
  Administered 2014-09-01: 2 mg via INTRAVENOUS
  Filled 2014-09-01: qty 2

## 2014-09-01 MED ORDER — LACTATED RINGERS IV SOLN
INTRAVENOUS | Status: DC
  Administered 2014-09-01: 21:00:00 via INTRAVENOUS

## 2014-09-01 MED ORDER — PHENYLEPHRINE 40 MCG/ML (10ML) SYRINGE FOR IV PUSH (FOR BLOOD PRESSURE SUPPORT)
80.0000 ug | PREFILLED_SYRINGE | INTRAVENOUS | Status: DC | PRN
  Filled 2014-09-01: qty 2

## 2014-09-01 MED ORDER — LACTATED RINGERS IV SOLN: 500.0000 mL | Freq: Once | INTRAVENOUS | Status: DC

## 2014-09-01 MED ORDER — EPHEDRINE 5 MG/ML INJ
10.0000 mg | INTRAVENOUS | Status: DC | PRN
  Filled 2014-09-01: qty 2

## 2014-09-01 MED ORDER — OXYTOCIN BOLUS FROM INFUSION
500.0000 mL | INTRAVENOUS | Status: DC
  Administered 2014-09-02: 500 mL via INTRAVENOUS

## 2014-09-01 MED ORDER — ACETAMINOPHEN 325 MG PO TABS: 650.0000 mg | ORAL_TABLET | ORAL | Status: DC | PRN

## 2014-09-01 MED ORDER — LIDOCAINE HCL (PF) 1 % IJ SOLN
INTRAMUSCULAR | Status: DC | PRN
  Administered 2014-09-01 (×2): 4 mL

## 2014-09-01 MED ORDER — CITRIC ACID-SODIUM CITRATE 334-500 MG/5ML PO SOLN: 30.0000 mL | ORAL | Status: DC | PRN

## 2014-09-01 MED ORDER — OXYCODONE-ACETAMINOPHEN 5-325 MG PO TABS
2.0000 | ORAL_TABLET | ORAL | Status: DC | PRN
Start: 2014-09-01 — End: 2014-09-02

## 2014-09-01 MED ORDER — OXYCODONE-ACETAMINOPHEN 5-325 MG PO TABS: 1.0000 | ORAL_TABLET | ORAL | Status: DC | PRN

## 2014-09-01 MED ORDER — DIPHENHYDRAMINE HCL 50 MG/ML IJ SOLN: 12.5000 mg | INTRAMUSCULAR | Status: DC | PRN

## 2014-09-01 MED ORDER — PENICILLIN G POTASSIUM 5000000 UNITS IJ SOLR
2.5000 10*6.[IU] | INTRAVENOUS | Status: DC
  Filled 2014-09-01 (×2): qty 2.5

## 2014-09-01 MED ORDER — FENTANYL 2.5 MCG/ML BUPIVACAINE 1/10 % EPIDURAL INFUSION (WH - ANES)
INTRAMUSCULAR | Status: DC | PRN
  Administered 2014-09-01: 14 mL/h via EPIDURAL

## 2014-09-01 MED ORDER — LIDOCAINE HCL (PF) 1 % IJ SOLN
30.0000 mL | INTRAMUSCULAR | Status: DC | PRN
  Filled 2014-09-01: qty 30

## 2014-09-01 MED ORDER — FLEET ENEMA 7-19 GM/118ML RE ENEM: 1.0000 | ENEMA | RECTAL | Status: DC | PRN

## 2014-09-01 MED ORDER — PHENYLEPHRINE 40 MCG/ML (10ML) SYRINGE FOR IV PUSH (FOR BLOOD PRESSURE SUPPORT)
80.0000 ug | PREFILLED_SYRINGE | INTRAVENOUS | Status: DC | PRN
Start: 2014-09-01 — End: 2014-09-02
  Filled 2014-09-01: qty 2

## 2014-09-01 MED ORDER — PROMETHAZINE HCL 25 MG/ML IJ SOLN
12.5000 mg | Freq: Once | INTRAMUSCULAR | Status: AC
  Administered 2014-09-01: 12.5 mg via INTRAVENOUS
  Filled 2014-09-01: qty 1

## 2014-09-01 MED ORDER — LACTATED RINGERS IV SOLN: 500.0000 mL | INTRAVENOUS | Status: DC | PRN

## 2014-09-01 MED ORDER — ONDANSETRON HCL 4 MG/2ML IJ SOLN: 4.0000 mg | Freq: Four times a day (QID) | INTRAMUSCULAR | Status: DC | PRN

## 2014-09-01 MED ORDER — OXYTOCIN 40 UNITS IN LACTATED RINGERS INFUSION - SIMPLE MED
62.5000 mL/h | INTRAVENOUS | Status: DC
  Administered 2014-09-02: 62.5 mL/h via INTRAVENOUS
  Filled 2014-09-01: qty 1000

## 2014-09-01 NOTE — Anesthesia Preprocedure Evaluation (Signed)
Anesthesia Evaluation  Patient identified by MRN, date of birth, ID band Patient awake    Reviewed: Allergy & Precautions, H&P , Patient's Chart, lab work & pertinent test results  Airway Mallampati: II  TM Distance: >3 FB Neck ROM: Full    Dental no notable dental hx. (+) Teeth Intact   Pulmonary neg pulmonary ROS,  breath sounds clear to auscultation  Pulmonary exam normal       Cardiovascular negative cardio ROS  Rhythm:Regular Rate:Normal     Neuro/Psych negative neurological ROS  negative psych ROS   GI/Hepatic negative GI ROS, Neg liver ROS,   Endo/Other  Obesity  Renal/GU negative Renal ROS  negative genitourinary   Musculoskeletal   Abdominal (+) + obese,   Peds  Hematology  (+) anemia ,   Anesthesia Other Findings   Reproductive/Obstetrics (+) Pregnancy Previous C/Section                             Anesthesia Physical Anesthesia Plan  ASA: II  Anesthesia Plan: Epidural   Post-op Pain Management:    Induction:   Airway Management Planned: Natural Airway  Additional Equipment:   Intra-op Plan:   Post-operative Plan:   Informed Consent: I have reviewed the patients History and Physical, chart, labs and discussed the procedure including the risks, benefits and alternatives for the proposed anesthesia with the patient or authorized representative who has indicated his/her understanding and acceptance.     Plan Discussed with: Anesthesiologist  Anesthesia Plan Comments:         Anesthesia Quick Evaluation

## 2014-09-01 NOTE — MAU Note (Signed)
Pt. States started leaking fluid and having contractions around 1500 today.  Pt. Is also having some bloody show.

## 2014-09-01 NOTE — Anesthesia Procedure Notes (Signed)
Epidural Patient location during procedure: OB Start time: 09/01/2014 8:55 PM  Staffing Anesthesiologist: Sharla Tankard A. Performed by: anesthesiologist   Preanesthetic Checklist Completed: patient identified, site marked, surgical consent, pre-op evaluation, timeout performed, IV checked, risks and benefits discussed and monitors and equipment checked  Epidural Patient position: sitting Prep: site prepped and draped and DuraPrep Patient monitoring: continuous pulse ox and blood pressure Approach: midline Location: L4-L5 Injection technique: LOR air  Needle:  Needle type: Tuohy  Needle gauge: 17 G Needle length: 9 cm and 9 Needle insertion depth: 6 cm Catheter type: closed end flexible Catheter size: 19 Gauge Catheter at skin depth: 11 cm Test dose: negative and Other  Assessment Events: blood not aspirated, injection not painful, no injection resistance, negative IV test and no paresthesia  Additional Notes Patient identified. Risks and benefits discussed including failed block, incomplete  Pain control, post dural puncture headache, nerve damage, paralysis, blood pressure Changes, nausea, vomiting, reactions to medications-both toxic and allergic and post Partum back pain. All questions were answered. Patient expressed understanding and wished to proceed. Sterile technique was used throughout procedure. Epidural site was Dressed with sterile barrier dressing. No paresthesias, signs of intravascular injection Or signs of intrathecal spread were encountered.  Patient was more comfortable after the epidural was dosed. Please see RN's note for documentation of vital signs and FHR which are stable.

## 2014-09-01 NOTE — Progress Notes (Signed)
Anesthesia unable to come at this time, informed provider.  IV pain medication ordered

## 2014-09-01 NOTE — H&P (Signed)
Tiffany FragminJanet Morrow is a 23 y.o. female presenting for SROM and labor sxs.  Pregnancy complicated by Oligo at 32 weeks without IUGR.  Received BMZ series.  Prev C/S for breech and IUGR pt desires TOL for VBAC.  Did Progesterone IM for history of PTD. History OB History    Gravida Para Term Preterm AB TAB SAB Ectopic Multiple Living   3 2 0 2 0 0 0 0 0 2      Past Medical History  Diagnosis Date  . Preterm labor    Past Surgical History  Procedure Laterality Date  . Cesarean section     Family History: family history is not on file. Social History:  reports that she has never smoked. She does not have any smokeless tobacco history on file. She reports that she does not drink alcohol or use illicit drugs.   Prenatal Transfer Tool  Maternal Diabetes: No Genetic Screening: Normal Maternal Ultrasounds/Referrals: Normal Fetal Ultrasounds or other Referrals:  None Maternal Substance Abuse:  No Significant Maternal Medications:  None Significant Maternal Lab Results:  None Other Comments:  None  ROS  Dilation: 4.5 Effacement (%): 90 Station: -2 Exam by:: Deidre Poe NP Blood pressure 130/85, pulse 115, temperature 98.2 F (36.8 C), height 5\' 3"  (1.6 m), weight 86.183 kg (190 lb), last menstrual period 12/13/2013. Exam Physical Exam  Prenatal labs: ABO, Rh: --/--/O POS (09/29 1405) Antibody: NEG (09/29 1405) Rubella: Immune (04/17 0000) RPR: NON REAC (09/29 1405)  HBsAg: Negative (04/17 0000)  HIV: Non-reactive (04/17 0000)  GBS: Negative (11/05 0000)   Assessment/Plan: IUP at term  Active labor.  Desires TOL/VBAC R&B discussed and pt gives informed consent    Jacinda Kanady C 09/01/2014, 8:23 PM

## 2014-09-02 ENCOUNTER — Encounter (HOSPITAL_COMMUNITY): Payer: Self-pay | Admitting: *Deleted

## 2014-09-02 LAB — CBC
HEMATOCRIT: 31.6 % — AB (ref 36.0–46.0)
HEMOGLOBIN: 10.5 g/dL — AB (ref 12.0–15.0)
MCH: 28.8 pg (ref 26.0–34.0)
MCHC: 33.2 g/dL (ref 30.0–36.0)
MCV: 86.6 fL (ref 78.0–100.0)
Platelets: 317 10*3/uL (ref 150–400)
RBC: 3.65 MIL/uL — AB (ref 3.87–5.11)
RDW: 13.4 % (ref 11.5–15.5)
WBC: 13.8 10*3/uL — AB (ref 4.0–10.5)

## 2014-09-02 LAB — RPR

## 2014-09-02 MED ORDER — OXYCODONE-ACETAMINOPHEN 5-325 MG PO TABS: 1.0000 | ORAL_TABLET | ORAL | Status: DC | PRN

## 2014-09-02 MED ORDER — SIMETHICONE 80 MG PO CHEW: 80.0000 mg | CHEWABLE_TABLET | ORAL | Status: DC | PRN

## 2014-09-02 MED ORDER — LANOLIN HYDROUS EX OINT: TOPICAL_OINTMENT | CUTANEOUS | Status: DC | PRN

## 2014-09-02 MED ORDER — IBUPROFEN 600 MG PO TABS
600.0000 mg | ORAL_TABLET | Freq: Four times a day (QID) | ORAL | Status: DC
  Administered 2014-09-02 – 2014-09-03 (×5): 600 mg via ORAL
  Filled 2014-09-02 (×5): qty 1

## 2014-09-02 MED ORDER — OXYCODONE-ACETAMINOPHEN 5-325 MG PO TABS: 2.0000 | ORAL_TABLET | ORAL | Status: DC | PRN

## 2014-09-02 MED ORDER — MEDROXYPROGESTERONE ACETATE 150 MG/ML IM SUSP
150.0000 mg | INTRAMUSCULAR | Status: DC | PRN
Start: 2014-09-02 — End: 2014-09-03

## 2014-09-02 MED ORDER — PRENATAL MULTIVITAMIN CH
1.0000 | ORAL_TABLET | Freq: Every day | ORAL | Status: DC
  Administered 2014-09-02 – 2014-09-03 (×2): 1 via ORAL
  Filled 2014-09-02 (×2): qty 1

## 2014-09-02 MED ORDER — ZOLPIDEM TARTRATE 5 MG PO TABS: 5.0000 mg | ORAL_TABLET | Freq: Every evening | ORAL | Status: DC | PRN

## 2014-09-02 MED ORDER — MEASLES, MUMPS & RUBELLA VAC ~~LOC~~ INJ
0.5000 mL | INJECTION | Freq: Once | SUBCUTANEOUS | Status: DC
  Filled 2014-09-02: qty 0.5

## 2014-09-02 MED ORDER — SENNOSIDES-DOCUSATE SODIUM 8.6-50 MG PO TABS
2.0000 | ORAL_TABLET | ORAL | Status: DC
  Administered 2014-09-03: 2 via ORAL
  Filled 2014-09-02: qty 2

## 2014-09-02 MED ORDER — ONDANSETRON HCL 4 MG/2ML IJ SOLN: 4.0000 mg | INTRAMUSCULAR | Status: DC | PRN

## 2014-09-02 MED ORDER — TETANUS-DIPHTH-ACELL PERTUSSIS 5-2.5-18.5 LF-MCG/0.5 IM SUSP: 0.5000 mL | Freq: Once | INTRAMUSCULAR | Status: DC

## 2014-09-02 MED ORDER — WITCH HAZEL-GLYCERIN EX PADS: 1.0000 "application " | MEDICATED_PAD | CUTANEOUS | Status: DC | PRN

## 2014-09-02 MED ORDER — DIBUCAINE 1 % RE OINT: 1.0000 "application " | TOPICAL_OINTMENT | RECTAL | Status: DC | PRN

## 2014-09-02 MED ORDER — BENZOCAINE-MENTHOL 20-0.5 % EX AERO
1.0000 "application " | INHALATION_SPRAY | CUTANEOUS | Status: DC | PRN
  Administered 2014-09-02: 1 via TOPICAL
  Filled 2014-09-02: qty 56

## 2014-09-02 MED ORDER — DIPHENHYDRAMINE HCL 25 MG PO CAPS: 25.0000 mg | ORAL_CAPSULE | Freq: Four times a day (QID) | ORAL | Status: DC | PRN

## 2014-09-02 MED ORDER — ONDANSETRON HCL 4 MG PO TABS: 4.0000 mg | ORAL_TABLET | ORAL | Status: DC | PRN

## 2014-09-02 NOTE — Lactation Note (Signed)
This note was copied from the chart of Girl Lillie FragminJanet Ricci. Lactation Consultation Note  Experienced BF mother breast fed 2 other children for about 6 weeks.  She reports stopping when she returned to work but that she has more support at work now.  Discussed strategies for successful BM feedings while at work.  Hand expression taught and mom was able to demonstrate.  Reviewed feeding cues.  Baby was sleeping when I was in the room so I asked mom to call out for latch checks every 8 hours.  Patient Name: Girl Lillie FragminJanet Kerrick Today's Date: 09/02/2014 Reason for consult: Initial assessment   Maternal Data Does the patient have breastfeeding experience prior to this delivery?: Yes  Feeding    LATCH Score/Interventions                      Lactation Tools Discussed/Used     Consult Status Consult Status: PRN    Soyla DryerJoseph, Junius Faucett 09/02/2014, 2:53 PM

## 2014-09-02 NOTE — Anesthesia Postprocedure Evaluation (Signed)
Anesthesia Post Note  Patient: Tiffany Morrow  Procedure(s) Performed: * No procedures listed *  Anesthesia type: Epidural  Patient location: Mother/Baby  Post pain: Pain level controlled  Post assessment: Post-op Vital signs reviewed  Last Vitals:  Filed Vitals:   09/02/14 0330  BP: 109/80  Pulse: 97  Temp: 36.9 C  Resp: 18    Post vital signs: Reviewed  Level of consciousness: awake  Complications: No apparent anesthesia complications

## 2014-09-02 NOTE — Progress Notes (Signed)
This note also relates to the following rows which could not be included: Pulse Rate - Cannot attach notes to unvalidated device data SpO2 - Cannot attach notes to unvalidated device data  Provider calls to check on pt, update given, orders to check pt and call with exam

## 2014-09-02 NOTE — Plan of Care (Signed)
Problem: Consults Goal: Postpartum Patient Education (See Patient Education module for education specifics.) Outcome: Completed/Met Date Met:  09/02/14  Problem: Phase I Progression Outcomes Goal: Pain controlled with appropriate interventions Outcome: Completed/Met Date Met:  09/02/14 Goal: Voiding adequately Outcome: Completed/Met Date Met:  09/02/14 Goal: OOB as tolerated unless otherwise ordered Outcome: Completed/Met Date Met:  09/02/14 Goal: VS, stable, temp < 100.4 degrees F Outcome: Completed/Met Date Met:  09/02/14 Goal: Initial discharge plan identified Outcome: Completed/Met Date Met:  09/02/14

## 2014-09-02 NOTE — Plan of Care (Signed)
Problem: Phase II Progression Outcomes Goal: Afebrile, VS remain stable Outcome: Completed/Met Date Met:  09/02/14 Goal: Rh isoimmunization per orders Outcome: Not Applicable Date Met:  48/25/00 Goal: Tolerating diet Outcome: Completed/Met Date Met:  09/02/14 Goal: Other Phase II Outcomes/Goals Outcome: Not Applicable Date Met:  37/04/88

## 2014-09-02 NOTE — Progress Notes (Signed)
Post Partum Day 1 Subjective: no complaints, up ad lib, voiding and tolerating PO  Objective: Blood pressure 106/63, pulse 93, temperature 98.8 F (37.1 C), temperature source Oral, resp. rate 18, height 5\' 3"  (1.6 m), weight 86.183 kg (190 lb), last menstrual period 12/13/2013, SpO2 97 %, unknown if currently breastfeeding.  Physical Exam:  General: alert, cooperative, appears stated age and no distress Lochia: appropriate Uterine Fundus: firm Incision: healing well DVT Evaluation: No evidence of DVT seen on physical exam.   Recent Labs  09/01/14 1941 09/02/14 0606  HGB 10.7* 10.5*  HCT 31.8* 31.6*    Assessment/Plan: Plan for discharge tomorrow   LOS: 1 day   Ulas Zuercher C 09/02/2014, 10:12 AM

## 2014-09-02 NOTE — Plan of Care (Signed)
Problem: Phase I Progression Outcomes Goal: Other Phase I Outcomes/Goals Outcome: Not Applicable Date Met:  35/46/56  Problem: Phase II Progression Outcomes Goal: Pain controlled on oral analgesia Outcome: Completed/Met Date Met:  09/02/14 Goal: Progress activity as tolerated unless otherwise ordered Outcome: Completed/Met Date Met:  09/02/14

## 2014-09-03 MED ORDER — IBUPROFEN 600 MG PO TABS: 600.0000 mg | ORAL_TABLET | Freq: Four times a day (QID) | ORAL | Status: AC

## 2014-09-03 NOTE — Plan of Care (Signed)
Problem: Discharge Progression Outcomes Goal: Barriers To Progression Addressed/Resolved Outcome: Not Applicable Date Met:  68/86/48 Goal: Activity appropriate for discharge plan Outcome: Completed/Met Date Met:  09/03/14 Goal: Tolerating diet Outcome: Completed/Met Date Met:  47/20/72 Goal: Complications resolved/controlled Outcome: Completed/Met Date Met:  09/03/14 Goal: Pain controlled with appropriate interventions Outcome: Completed/Met Date Met:  09/03/14 Goal: Afebrile, VS remain stable at discharge Outcome: Completed/Met Date Met:  09/03/14 Goal: Remove staples per MD order Outcome: Not Applicable Date Met:  18/28/83 Goal: MMR given as ordered Outcome: Not Applicable Date Met:  37/44/51 Goal: Discharge plan in place and appropriate Outcome: Completed/Met Date Met:  09/03/14 Goal: Other Discharge Outcomes/Goals Outcome: Not Applicable Date Met:  46/04/79

## 2014-09-03 NOTE — Discharge Summary (Signed)
Obstetric Discharge Summary Reason for Admission: SROM and labor Prenatal Procedures: none Intrapartum Procedures: spontaneous vaginal delivery Postpartum Procedures: none Complications-Operative and Postpartum: 2 degree perineal laceration HEMOGLOBIN  Date Value Ref Range Status  09/02/2014 10.5* 12.0 - 15.0 g/dL Final   HCT  Date Value Ref Range Status  09/02/2014 31.6* 36.0 - 46.0 % Final    Physical Exam:  General: alert, cooperative, appears stated age and no distress Lochia: appropriate Uterine Fundus: firm Incision: healing well DVT Evaluation: No evidence of DVT seen on physical exam.  Discharge Diagnoses: Term Pregnancy-delivered  Discharge Information: Date: 09/03/2014 Activity: pelvic rest Diet: routine Medications: Ibuprofen Condition: stable Instructions: refer to practice specific booklet Discharge to: home   Newborn Data: Live born female  Birth Weight: 6 lb 0.7 oz (2741 g) APGAR: 8, 9  Home with mother.  Lenyx Boody C 09/03/2014, 9:52 AM
